# Patient Record
Sex: Female | Born: 1980 | Race: Black or African American | Hispanic: No | Marital: Single
Health system: Southern US, Community
[De-identification: ages and names within clinical notes are randomized; demographics above are authoritative.]

---

## 2016-07-21 DIAGNOSIS — Z0100 Encounter for examination of eyes and vision without abnormal findings: Secondary | ICD-10-CM | POA: Diagnosis not present

## 2017-08-11 DIAGNOSIS — Z0100 Encounter for examination of eyes and vision without abnormal findings: Secondary | ICD-10-CM | POA: Diagnosis not present

## 2018-05-19 DIAGNOSIS — Z131 Encounter for screening for diabetes mellitus: Secondary | ICD-10-CM | POA: Diagnosis not present

## 2018-05-19 DIAGNOSIS — I10 Essential (primary) hypertension: Secondary | ICD-10-CM | POA: Diagnosis not present

## 2018-05-19 DIAGNOSIS — Z716 Tobacco abuse counseling: Secondary | ICD-10-CM | POA: Diagnosis not present

## 2018-05-19 DIAGNOSIS — Z124 Encounter for screening for malignant neoplasm of cervix: Secondary | ICD-10-CM | POA: Diagnosis not present

## 2018-05-19 DIAGNOSIS — F17210 Nicotine dependence, cigarettes, uncomplicated: Secondary | ICD-10-CM | POA: Diagnosis not present

## 2018-05-19 DIAGNOSIS — Z13220 Encounter for screening for lipoid disorders: Secondary | ICD-10-CM | POA: Diagnosis not present

## 2018-05-19 DIAGNOSIS — Z0000 Encounter for general adult medical examination without abnormal findings: Secondary | ICD-10-CM | POA: Diagnosis not present

## 2018-05-19 DIAGNOSIS — N760 Acute vaginitis: Secondary | ICD-10-CM | POA: Diagnosis not present

## 2018-08-18 DIAGNOSIS — Z716 Tobacco abuse counseling: Secondary | ICD-10-CM | POA: Diagnosis not present

## 2018-08-18 DIAGNOSIS — G44209 Tension-type headache, unspecified, not intractable: Secondary | ICD-10-CM | POA: Diagnosis not present

## 2018-08-18 DIAGNOSIS — F17210 Nicotine dependence, cigarettes, uncomplicated: Secondary | ICD-10-CM | POA: Diagnosis not present

## 2018-08-18 DIAGNOSIS — I10 Essential (primary) hypertension: Secondary | ICD-10-CM | POA: Diagnosis not present

## 2018-08-22 DIAGNOSIS — H52223 Regular astigmatism, bilateral: Secondary | ICD-10-CM | POA: Diagnosis not present

## 2018-09-29 DIAGNOSIS — F17210 Nicotine dependence, cigarettes, uncomplicated: Secondary | ICD-10-CM | POA: Diagnosis not present

## 2018-09-29 DIAGNOSIS — Z716 Tobacco abuse counseling: Secondary | ICD-10-CM | POA: Diagnosis not present

## 2018-09-29 DIAGNOSIS — E559 Vitamin D deficiency, unspecified: Secondary | ICD-10-CM | POA: Diagnosis not present

## 2018-09-29 DIAGNOSIS — L259 Unspecified contact dermatitis, unspecified cause: Secondary | ICD-10-CM | POA: Diagnosis not present

## 2018-09-29 DIAGNOSIS — Z23 Encounter for immunization: Secondary | ICD-10-CM | POA: Diagnosis not present

## 2018-09-29 DIAGNOSIS — I10 Essential (primary) hypertension: Secondary | ICD-10-CM | POA: Diagnosis not present

## 2018-12-29 DIAGNOSIS — I10 Essential (primary) hypertension: Secondary | ICD-10-CM | POA: Diagnosis not present

## 2018-12-29 DIAGNOSIS — Z716 Tobacco abuse counseling: Secondary | ICD-10-CM | POA: Diagnosis not present

## 2018-12-29 DIAGNOSIS — F17210 Nicotine dependence, cigarettes, uncomplicated: Secondary | ICD-10-CM | POA: Diagnosis not present

## 2019-03-30 DIAGNOSIS — Z0000 Encounter for general adult medical examination without abnormal findings: Secondary | ICD-10-CM | POA: Diagnosis not present

## 2019-03-30 DIAGNOSIS — Z13220 Encounter for screening for lipoid disorders: Secondary | ICD-10-CM | POA: Diagnosis not present

## 2019-03-30 DIAGNOSIS — F17210 Nicotine dependence, cigarettes, uncomplicated: Secondary | ICD-10-CM | POA: Diagnosis not present

## 2019-03-30 DIAGNOSIS — Z131 Encounter for screening for diabetes mellitus: Secondary | ICD-10-CM | POA: Diagnosis not present

## 2019-03-30 DIAGNOSIS — Z716 Tobacco abuse counseling: Secondary | ICD-10-CM | POA: Diagnosis not present

## 2019-03-30 DIAGNOSIS — N3080 Other cystitis without hematuria: Secondary | ICD-10-CM | POA: Diagnosis not present

## 2019-03-30 DIAGNOSIS — I10 Essential (primary) hypertension: Secondary | ICD-10-CM | POA: Diagnosis not present

## 2019-05-11 DIAGNOSIS — I10 Essential (primary) hypertension: Secondary | ICD-10-CM | POA: Diagnosis not present

## 2019-05-11 DIAGNOSIS — F17210 Nicotine dependence, cigarettes, uncomplicated: Secondary | ICD-10-CM | POA: Diagnosis not present

## 2019-05-11 DIAGNOSIS — E559 Vitamin D deficiency, unspecified: Secondary | ICD-10-CM | POA: Diagnosis not present

## 2019-08-10 DIAGNOSIS — I10 Essential (primary) hypertension: Secondary | ICD-10-CM | POA: Diagnosis not present

## 2019-09-26 DIAGNOSIS — Z23 Encounter for immunization: Secondary | ICD-10-CM | POA: Diagnosis not present

## 2020-01-23 DIAGNOSIS — Z13220 Encounter for screening for lipoid disorders: Secondary | ICD-10-CM | POA: Diagnosis not present

## 2020-01-23 DIAGNOSIS — I10 Essential (primary) hypertension: Secondary | ICD-10-CM | POA: Diagnosis not present

## 2020-01-23 DIAGNOSIS — Z131 Encounter for screening for diabetes mellitus: Secondary | ICD-10-CM | POA: Diagnosis not present

## 2020-01-23 DIAGNOSIS — F17210 Nicotine dependence, cigarettes, uncomplicated: Secondary | ICD-10-CM | POA: Diagnosis not present

## 2020-01-23 DIAGNOSIS — E559 Vitamin D deficiency, unspecified: Secondary | ICD-10-CM | POA: Diagnosis not present

## 2020-01-23 DIAGNOSIS — Z716 Tobacco abuse counseling: Secondary | ICD-10-CM | POA: Diagnosis not present

## 2020-01-23 DIAGNOSIS — Z0000 Encounter for general adult medical examination without abnormal findings: Secondary | ICD-10-CM | POA: Diagnosis not present

## 2020-04-08 ENCOUNTER — Emergency Department (HOSPITAL_COMMUNITY)
Admission: EM | Admit: 2020-04-08 | Discharge: 2020-04-08 | Disposition: A | Discharge status: Home / Self Care | Payer: BC Managed Care – PPO | Attending: Emergency Medicine | Admitting: Emergency Medicine

## 2020-04-08 ENCOUNTER — Encounter (HOSPITAL_COMMUNITY): Payer: Self-pay | Admitting: Emergency Medicine

## 2020-04-08 ENCOUNTER — Emergency Department (HOSPITAL_COMMUNITY): Payer: BC Managed Care – PPO

## 2020-04-08 DIAGNOSIS — R0789 Other chest pain: Secondary | ICD-10-CM | POA: Diagnosis not present

## 2020-04-08 DIAGNOSIS — M7918 Myalgia, other site: Secondary | ICD-10-CM | POA: Diagnosis not present

## 2020-04-08 DIAGNOSIS — R079 Chest pain, unspecified: Secondary | ICD-10-CM | POA: Diagnosis not present

## 2020-04-08 DIAGNOSIS — J939 Pneumothorax, unspecified: Secondary | ICD-10-CM

## 2020-04-08 DIAGNOSIS — R42 Dizziness and giddiness: Secondary | ICD-10-CM | POA: Diagnosis not present

## 2020-04-08 MED ORDER — METHOCARBAMOL 500 MG PO TABS: 500.0000 mg | ORAL_TABLET | Freq: Two times a day (BID) | ORAL | 0 refills | Status: AC
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
## 2020-04-08 NOTE — ED Notes (Signed)
Patient verbalizes understanding of discharge instructions. Opportunity for questioning and answers were provided. Armband removed by staff, pt discharged from ED ambulatory.   

## 2020-04-08 NOTE — Discharge Instructions (Signed)
Please use Robaxin as prescribed for pain.  Please use Tylenol and ibuprofen as well.  Please rest, hydrate, gentle exercise and take several days off work.  I provided you with a work note for this.  Please use Tylenol or ibuprofen for pain.  You may use 600 mg ibuprofen every 6 hours or 1000 mg of Tylenol every 6 hours.  You may choose to alternate between the 2.  This would be most effective.  Not to exceed 4 g of Tylenol within 24 hours.  Not to exceed 3200 mg ibuprofen 24 hours.
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
## 2020-07-23 DIAGNOSIS — N760 Acute vaginitis: Secondary | ICD-10-CM | POA: Diagnosis not present

## 2020-07-23 DIAGNOSIS — F17210 Nicotine dependence, cigarettes, uncomplicated: Secondary | ICD-10-CM | POA: Diagnosis not present

## 2020-07-23 DIAGNOSIS — Z716 Tobacco abuse counseling: Secondary | ICD-10-CM | POA: Diagnosis not present

## 2020-07-23 DIAGNOSIS — I10 Essential (primary) hypertension: Secondary | ICD-10-CM | POA: Diagnosis not present

## 2020-07-23 DIAGNOSIS — Z124 Encounter for screening for malignant neoplasm of cervix: Secondary | ICD-10-CM | POA: Diagnosis not present

## 2020-10-02 DIAGNOSIS — H6123 Impacted cerumen, bilateral: Secondary | ICD-10-CM | POA: Diagnosis not present

## 2020-10-02 DIAGNOSIS — H9202 Otalgia, left ear: Secondary | ICD-10-CM | POA: Diagnosis not present

## 2020-10-22 DIAGNOSIS — E669 Obesity, unspecified: Secondary | ICD-10-CM | POA: Diagnosis not present

## 2020-10-22 DIAGNOSIS — L2089 Other atopic dermatitis: Secondary | ICD-10-CM | POA: Diagnosis not present

## 2020-10-22 DIAGNOSIS — E559 Vitamin D deficiency, unspecified: Secondary | ICD-10-CM | POA: Diagnosis not present

## 2020-10-22 DIAGNOSIS — I10 Essential (primary) hypertension: Secondary | ICD-10-CM | POA: Diagnosis not present

## 2020-10-31 DIAGNOSIS — Z23 Encounter for immunization: Secondary | ICD-10-CM | POA: Diagnosis not present

## 2021-01-21 ENCOUNTER — Other Ambulatory Visit: Payer: Self-pay | Admitting: Internal Medicine

## 2021-01-21 DIAGNOSIS — L2089 Other atopic dermatitis: Secondary | ICD-10-CM | POA: Diagnosis not present

## 2021-01-21 DIAGNOSIS — I10 Essential (primary) hypertension: Secondary | ICD-10-CM | POA: Diagnosis not present

## 2021-01-21 DIAGNOSIS — Z13220 Encounter for screening for lipoid disorders: Secondary | ICD-10-CM | POA: Diagnosis not present

## 2021-01-21 DIAGNOSIS — Z0000 Encounter for general adult medical examination without abnormal findings: Secondary | ICD-10-CM | POA: Diagnosis not present

## 2021-01-21 DIAGNOSIS — Z131 Encounter for screening for diabetes mellitus: Secondary | ICD-10-CM | POA: Diagnosis not present

## 2021-01-21 DIAGNOSIS — E559 Vitamin D deficiency, unspecified: Secondary | ICD-10-CM | POA: Diagnosis not present

## 2021-01-22 LAB — COMPLETE METABOLIC PANEL WITH GFR
AG Ratio: 1.3 (calc) (ref 1.0–2.5)
ALT: 13 U/L (ref 6–29)
AST: 25 U/L (ref 10–30)
Albumin: 4.3 g/dL (ref 3.6–5.1)
Alkaline phosphatase (APISO): 82 U/L (ref 31–125)
BUN: 13 mg/dL (ref 7–25)
CO2: 23 mmol/L (ref 20–32)
Calcium: 9.8 mg/dL (ref 8.6–10.2)
Chloride: 101 mmol/L (ref 98–110)
Creat: 0.87 mg/dL (ref 0.50–1.10)
GFR, Est African American: 97 mL/min/{1.73_m2} (ref >=60)
GFR, Est Non African American: 84 mL/min/{1.73_m2} (ref >=60)
Globulin: 3.2 g/dL (calc) (ref 1.9–3.7)
Glucose, Bld: 75 mg/dL (ref 65–99)
Potassium: 4.5 mmol/L (ref 3.5–5.3)
Sodium: 137 mmol/L (ref 135–146)
Total Bilirubin: 0.4 mg/dL (ref 0.2–1.2)
Total Protein: 7.5 g/dL (ref 6.1–8.1)

## 2021-01-22 LAB — CBC
HCT: 42.2 % (ref 35.0–45.0)
Hemoglobin: 14.2 g/dL (ref 11.7–15.5)
MCH: 29.6 pg (ref 27.0–33.0)
MCHC: 33.6 g/dL (ref 32.0–36.0)
MCV: 87.9 fL (ref 80.0–100.0)
MPV: 13.1 fL — ABNORMAL HIGH (ref 7.5–12.5)
Platelets: 249 10*3/uL (ref 140–400)
RBC: 4.8 10*6/uL (ref 3.80–5.10)
RDW: 13.7 % (ref 11.0–15.0)
WBC: 10.6 10*3/uL (ref 3.8–10.8)

## 2021-01-22 LAB — LIPID PANEL
Cholesterol: 170 mg/dL (ref <200)
HDL: 58 mg/dL (ref >=50)
LDL Cholesterol (Calc): 93 mg/dL (calc)
Non-HDL Cholesterol (Calc): 112 mg/dL (calc) (ref <130)
Total CHOL/HDL Ratio: 2.9 (calc) (ref <5.0)
Triglycerides: 91 mg/dL (ref <150)

## 2021-01-22 LAB — TSH: TSH: 2.78 mIU/L

## 2021-01-22 LAB — VITAMIN D 25 HYDROXY (VIT D DEFICIENCY, FRACTURES): Vit D, 25-Hydroxy: 55 ng/mL (ref 30–100)

## 2021-03-05 DIAGNOSIS — R82998 Other abnormal findings in urine: Secondary | ICD-10-CM | POA: Diagnosis not present

## 2021-03-05 DIAGNOSIS — N898 Other specified noninflammatory disorders of vagina: Secondary | ICD-10-CM | POA: Diagnosis not present

## 2021-03-05 DIAGNOSIS — F17210 Nicotine dependence, cigarettes, uncomplicated: Secondary | ICD-10-CM | POA: Diagnosis not present

## 2021-03-05 DIAGNOSIS — R300 Dysuria: Secondary | ICD-10-CM | POA: Diagnosis not present

## 2021-03-05 DIAGNOSIS — N926 Irregular menstruation, unspecified: Secondary | ICD-10-CM | POA: Diagnosis not present

## 2021-03-12 DIAGNOSIS — Z6829 Body mass index (BMI) 29.0-29.9, adult: Secondary | ICD-10-CM | POA: Diagnosis not present

## 2021-03-12 DIAGNOSIS — Z1612 Extended spectrum beta lactamase (ESBL) resistance: Secondary | ICD-10-CM | POA: Diagnosis not present

## 2021-03-12 DIAGNOSIS — N390 Urinary tract infection, site not specified: Secondary | ICD-10-CM | POA: Diagnosis not present

## 2021-03-12 DIAGNOSIS — B9629 Other Escherichia coli [E. coli] as the cause of diseases classified elsewhere: Secondary | ICD-10-CM | POA: Diagnosis not present

## 2021-03-12 DIAGNOSIS — F17210 Nicotine dependence, cigarettes, uncomplicated: Secondary | ICD-10-CM | POA: Diagnosis not present

## 2021-05-30 ENCOUNTER — Ambulatory Visit: Payer: Self-pay

## 2021-05-30 ENCOUNTER — Other Ambulatory Visit: Payer: Self-pay

## 2021-05-30 ENCOUNTER — Other Ambulatory Visit: Payer: Self-pay | Admitting: Family Medicine

## 2021-05-30 DIAGNOSIS — Z0000 Encounter for general adult medical examination without abnormal findings: Secondary | ICD-10-CM

## 2021-09-12 ENCOUNTER — Other Ambulatory Visit: Payer: Self-pay | Admitting: Family Medicine

## 2021-09-12 DIAGNOSIS — Z1231 Encounter for screening mammogram for malignant neoplasm of breast: Secondary | ICD-10-CM

## 2021-10-16 ENCOUNTER — Ambulatory Visit: Payer: BC Managed Care – PPO

## 2021-11-11 ENCOUNTER — Ambulatory Visit
Admission: RE | Admit: 2021-11-11 | Discharge: 2021-11-11 | Disposition: A | Discharge status: Home / Self Care | Payer: Managed Care, Other (non HMO) | Source: Ambulatory Visit | Attending: Family Medicine | Admitting: Family Medicine

## 2021-11-11 ENCOUNTER — Other Ambulatory Visit: Payer: Self-pay

## 2021-11-11 DIAGNOSIS — Z1231 Encounter for screening mammogram for malignant neoplasm of breast: Secondary | ICD-10-CM

## 2021-11-12 ENCOUNTER — Other Ambulatory Visit: Payer: Self-pay | Admitting: Family Medicine

## 2021-11-12 DIAGNOSIS — R928 Other abnormal and inconclusive findings on diagnostic imaging of breast: Secondary | ICD-10-CM

## 2021-12-17 ENCOUNTER — Other Ambulatory Visit: Payer: Self-pay | Admitting: Family Medicine

## 2021-12-17 ENCOUNTER — Ambulatory Visit
Admission: RE | Admit: 2021-12-17 | Discharge: 2021-12-17 | Disposition: A | Discharge status: Home / Self Care | Payer: Managed Care, Other (non HMO) | Source: Ambulatory Visit | Attending: Family Medicine | Admitting: Family Medicine

## 2021-12-17 DIAGNOSIS — R928 Other abnormal and inconclusive findings on diagnostic imaging of breast: Secondary | ICD-10-CM

## 2021-12-17 DIAGNOSIS — N6320 Unspecified lump in the left breast, unspecified quadrant: Secondary | ICD-10-CM

## 2021-12-31 ENCOUNTER — Ambulatory Visit
Admission: RE | Admit: 2021-12-31 | Discharge: 2021-12-31 | Disposition: A | Discharge status: Home / Self Care | Payer: Managed Care, Other (non HMO) | Source: Ambulatory Visit | Attending: Family Medicine | Admitting: Family Medicine

## 2021-12-31 DIAGNOSIS — N6320 Unspecified lump in the left breast, unspecified quadrant: Secondary | ICD-10-CM

## 2022-01-18 IMAGING — MG MM BREAST LOCALIZATION CLIP
4 series · 4 of 12 positions shown · non-contrast
Comparison: Previous exam(s).

CLINICAL DATA: Status post ultrasound-guided core biopsy of LEFT
breast mass.

EXAM:
3D DIAGNOSTIC LEFT MAMMOGRAM POST ULTRASOUND BIOPSY

[L ML synth-2D]
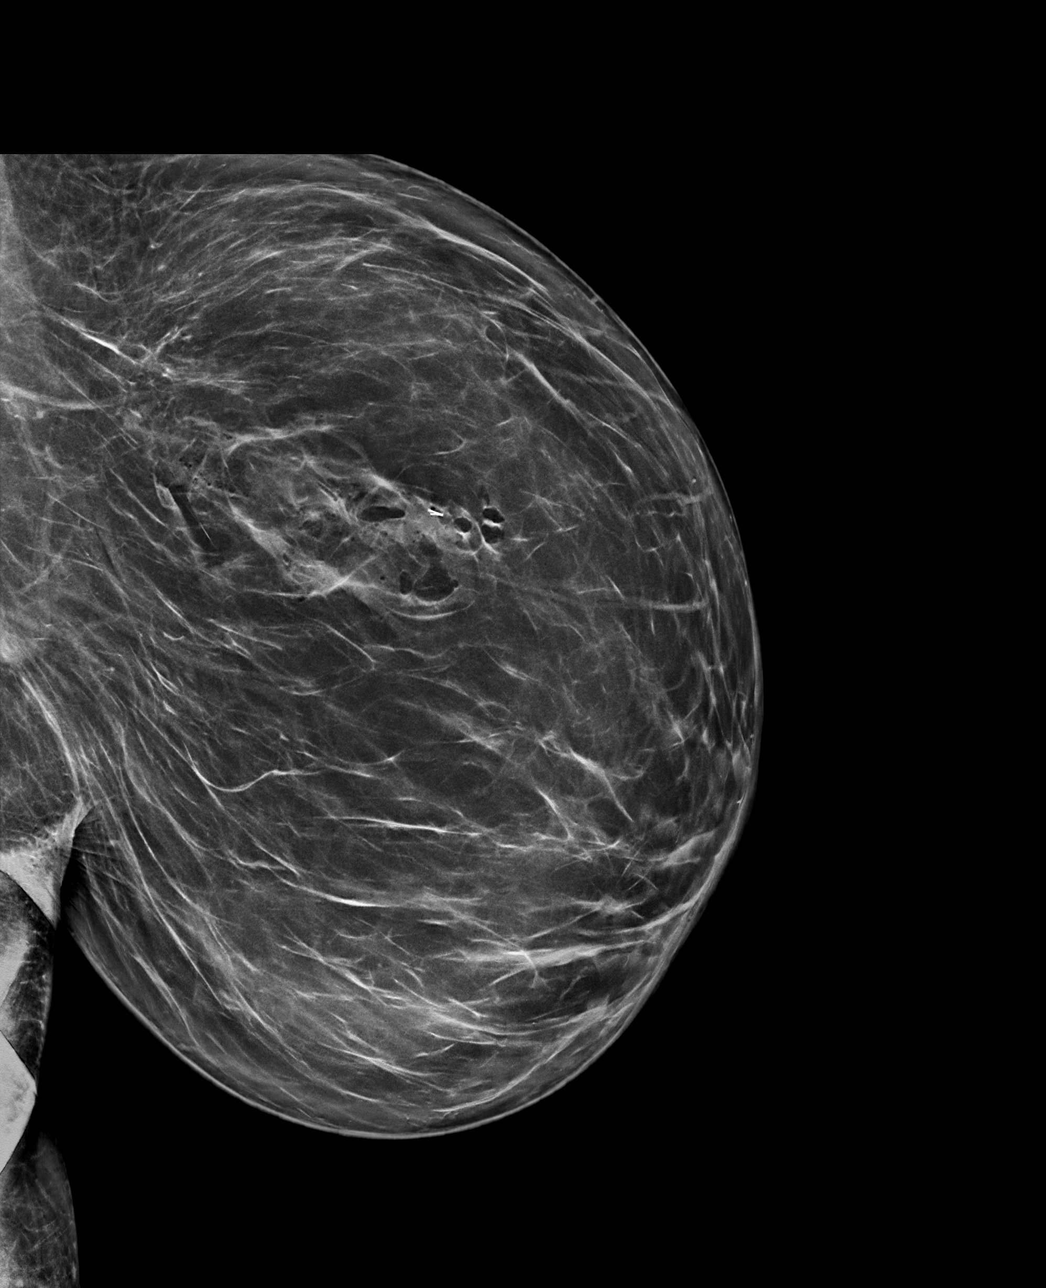

[L CC synth-2D]
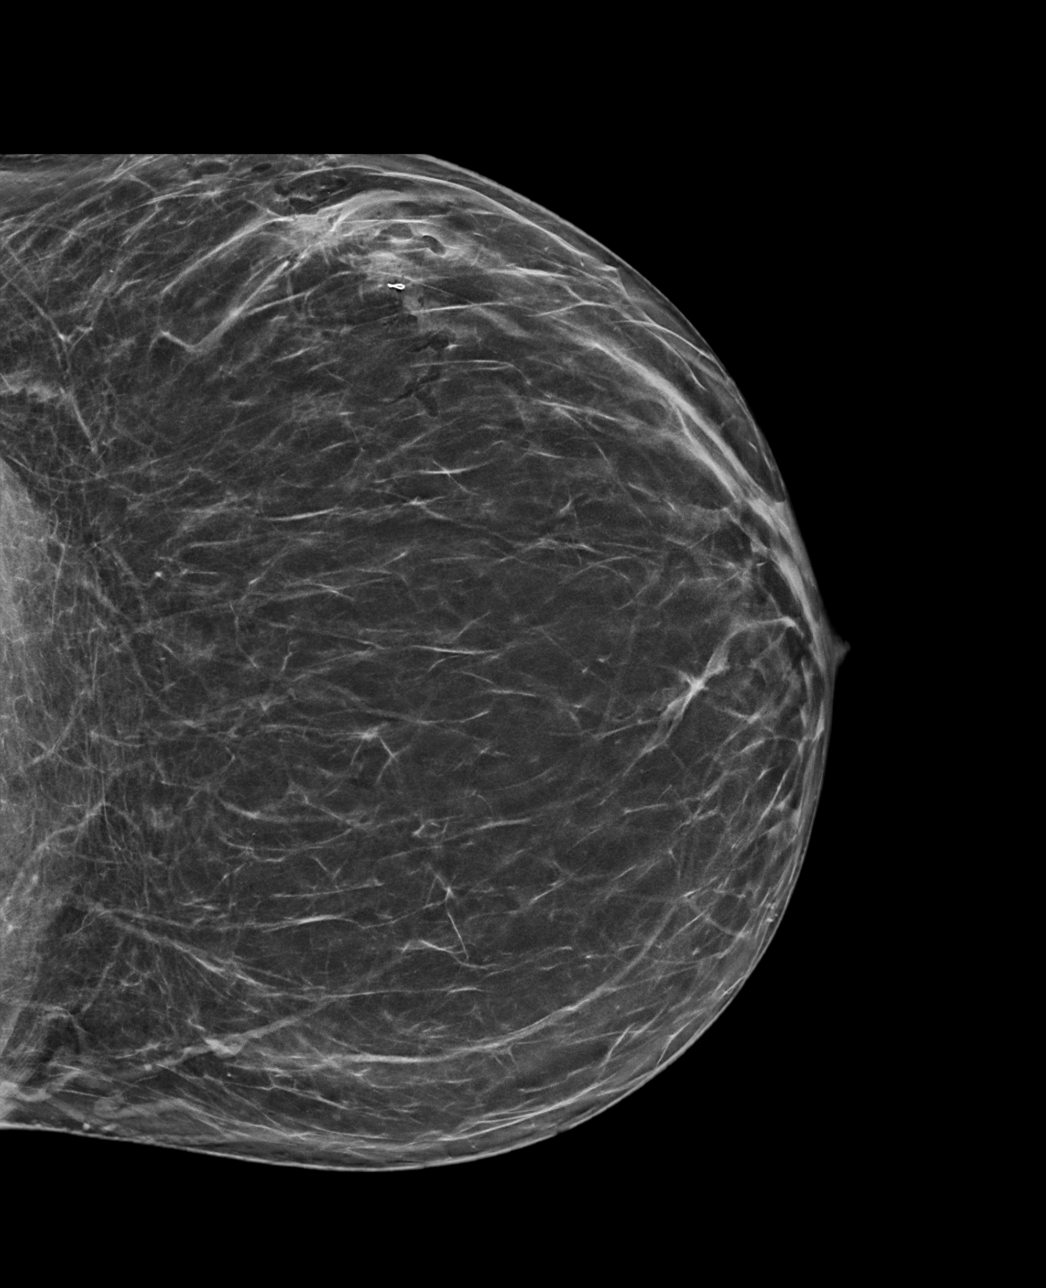

[L ML tomo · tomo slice 43/85.0]
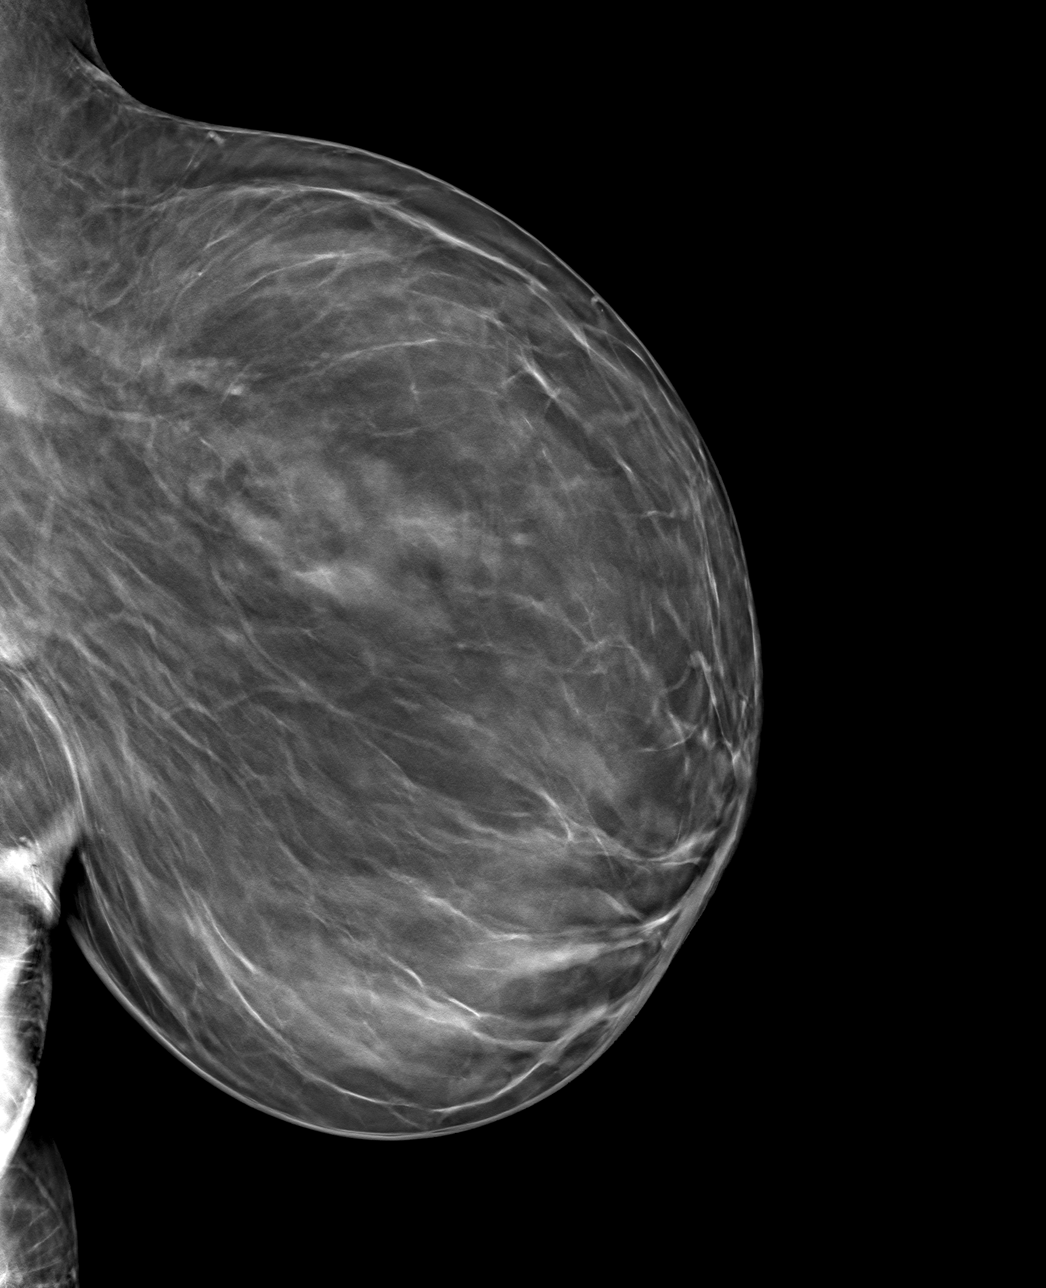

[L CC tomo · tomo slice 35/70.0]
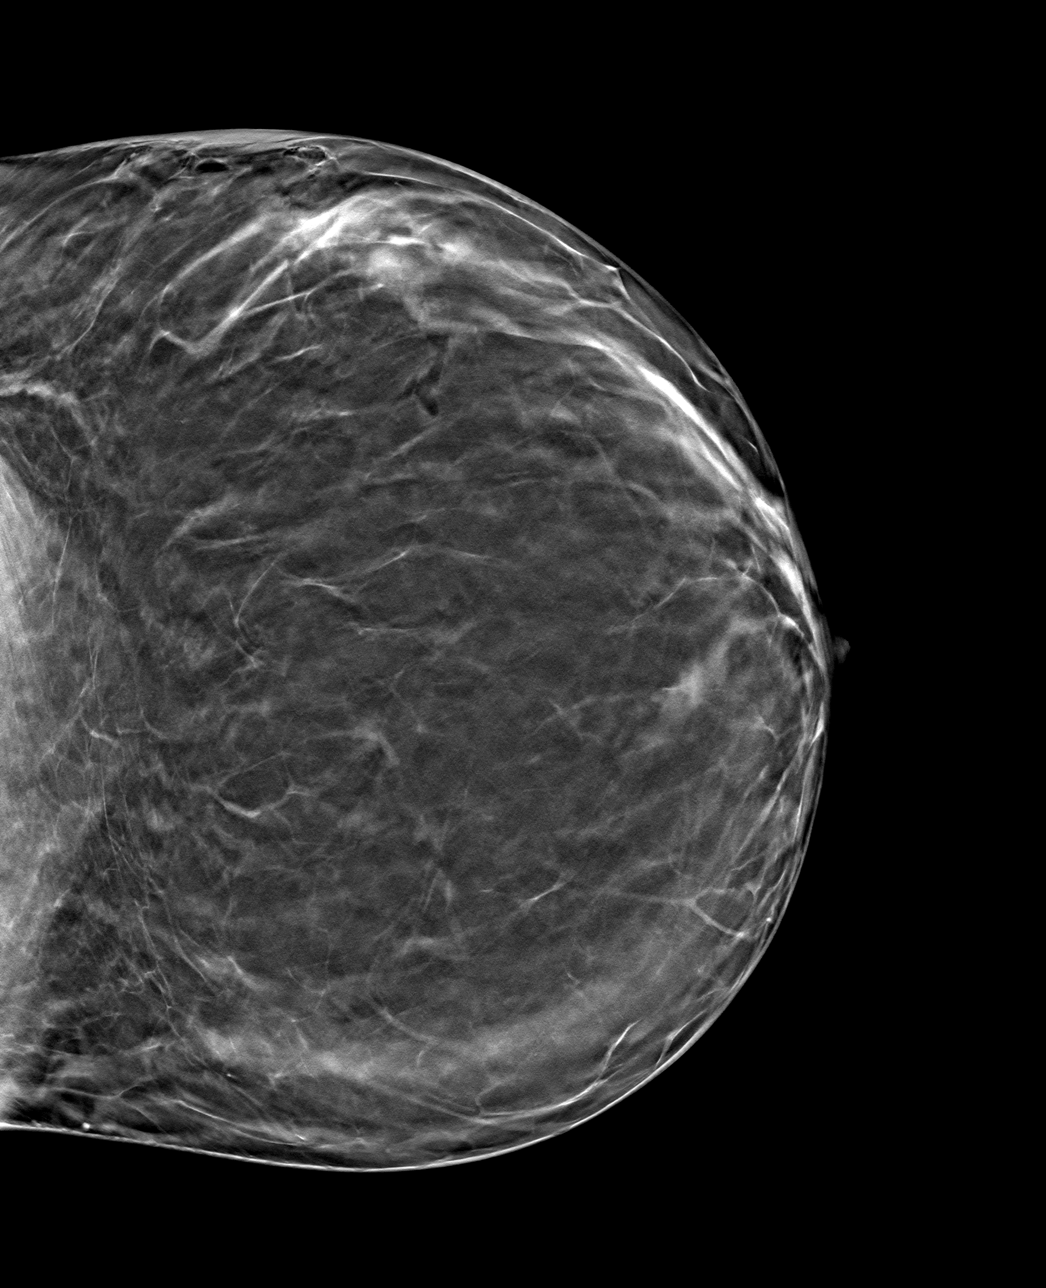

[4 of 12 positions shown; findings below may reference images not displayed]

FINDINGS: 3D Mammographic images were obtained following ultrasound guided
biopsy of mass in the 2 o'clock location of the LEFT breast and
placement of a ribbon shaped clip. The biopsy marking clip is in
expected position at the site of biopsy.
IMPRESSION: Appropriate positioning of the ribbon shaped biopsy marking clip at
the site of biopsy in the UPPER-OUTER QUADRANT.

Final Assessment: Post Procedure Mammograms for Marker Placement
